# Patient Record
Sex: Male | Born: 1985 | Race: Black or African American | Hispanic: No | Marital: Single
Health system: Southern US, Community
[De-identification: ages and names within clinical notes are randomized; demographics above are authoritative.]

---

## 2010-07-11 ENCOUNTER — Emergency Department (HOSPITAL_BASED_OUTPATIENT_CLINIC_OR_DEPARTMENT_OTHER)
Admission: EM | Admit: 2010-07-11 | Discharge: 2010-07-11 | Disposition: A | Discharge status: Home / Self Care | Payer: Self-pay | Attending: Emergency Medicine | Admitting: Emergency Medicine

## 2010-07-11 ENCOUNTER — Emergency Department (INDEPENDENT_AMBULATORY_CARE_PROVIDER_SITE_OTHER): Payer: Self-pay

## 2010-07-11 DIAGNOSIS — M25579 Pain in unspecified ankle and joints of unspecified foot: Secondary | ICD-10-CM

## 2010-07-11 DIAGNOSIS — S9030XA Contusion of unspecified foot, initial encounter: Secondary | ICD-10-CM

## 2010-07-11 DIAGNOSIS — IMO0002 Reserved for concepts with insufficient information to code with codable children: Secondary | ICD-10-CM | POA: Insufficient documentation

## 2010-07-11 DIAGNOSIS — S93609A Unspecified sprain of unspecified foot, initial encounter: Secondary | ICD-10-CM | POA: Insufficient documentation

## 2011-10-13 IMAGING — CR DG FOOT COMPLETE 3+V*L*
3 series · 3 of 3 positions shown · non-contrast
Comparison: None.

CLINICAL DATA: Injury, pain.

LEFT FOOT - COMPLETE 3+ VIEW

[t foot ap left]
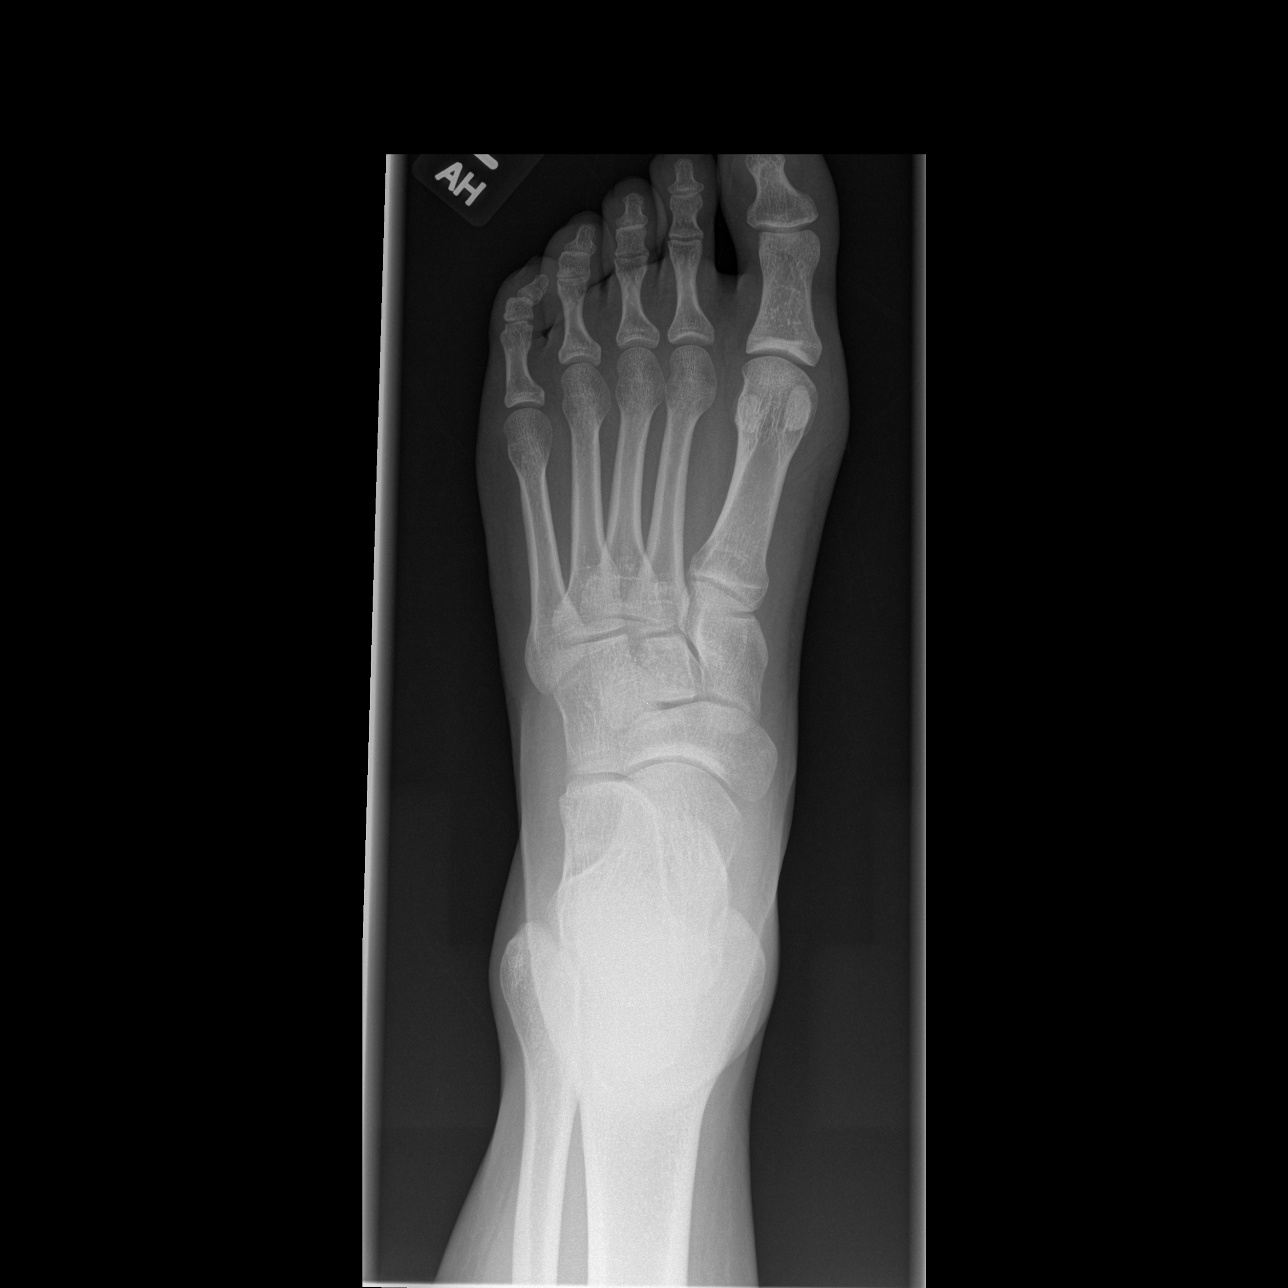

[t foot oblique left]
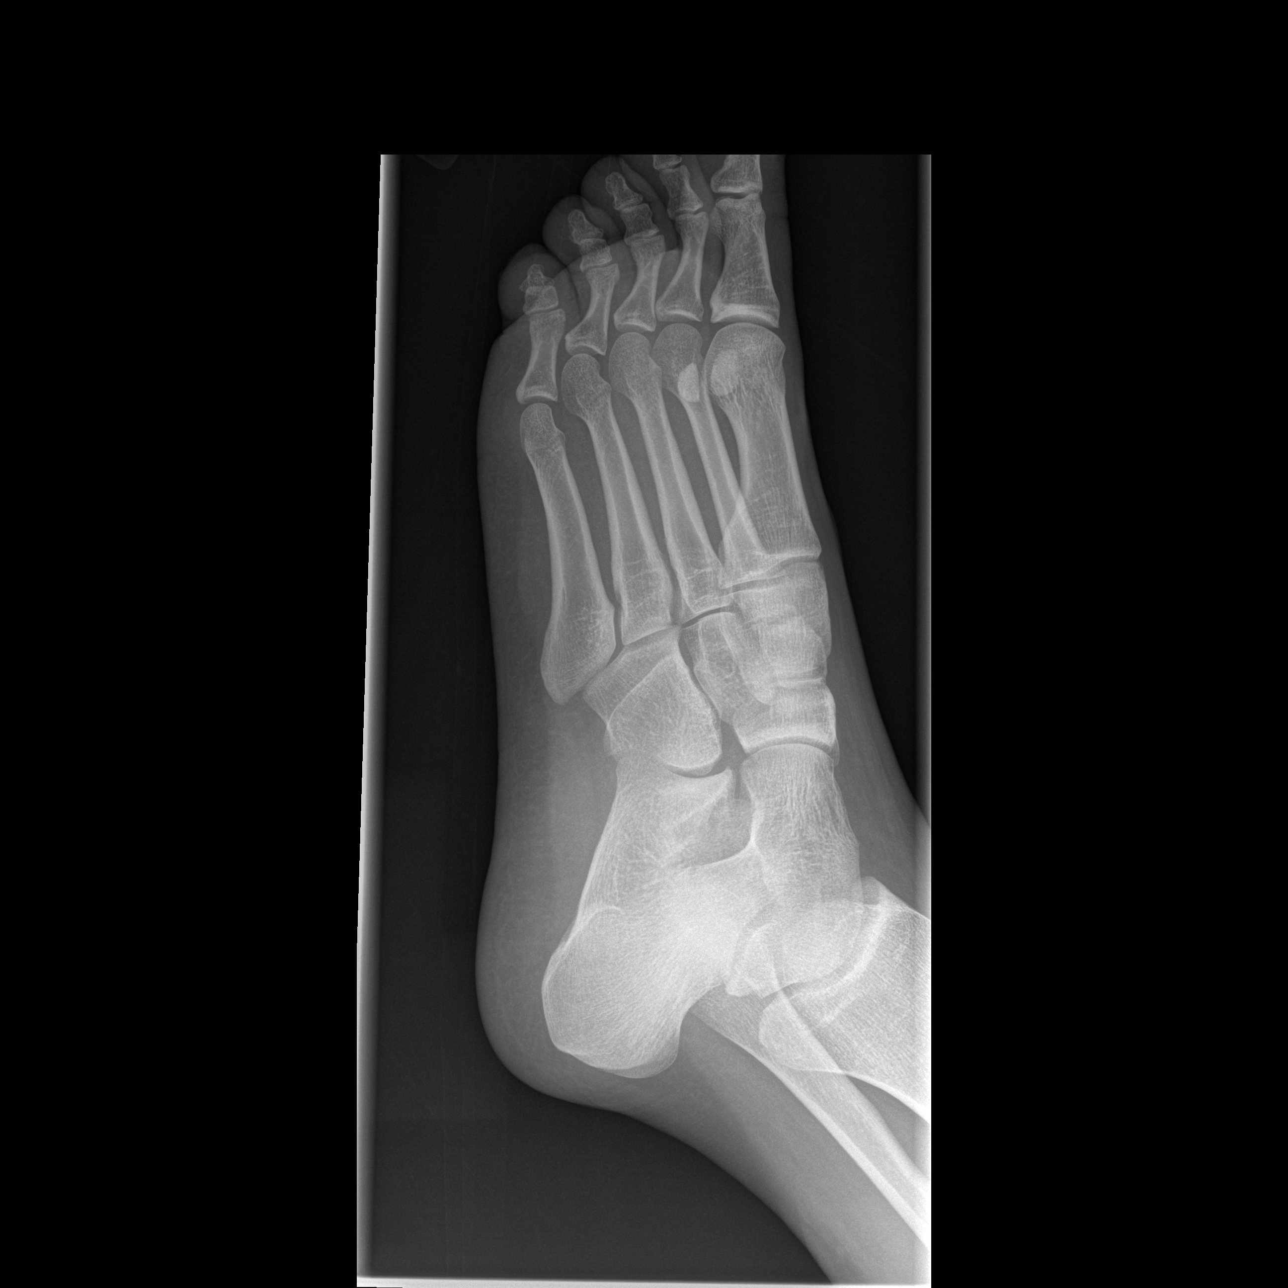

[t foot lat left]
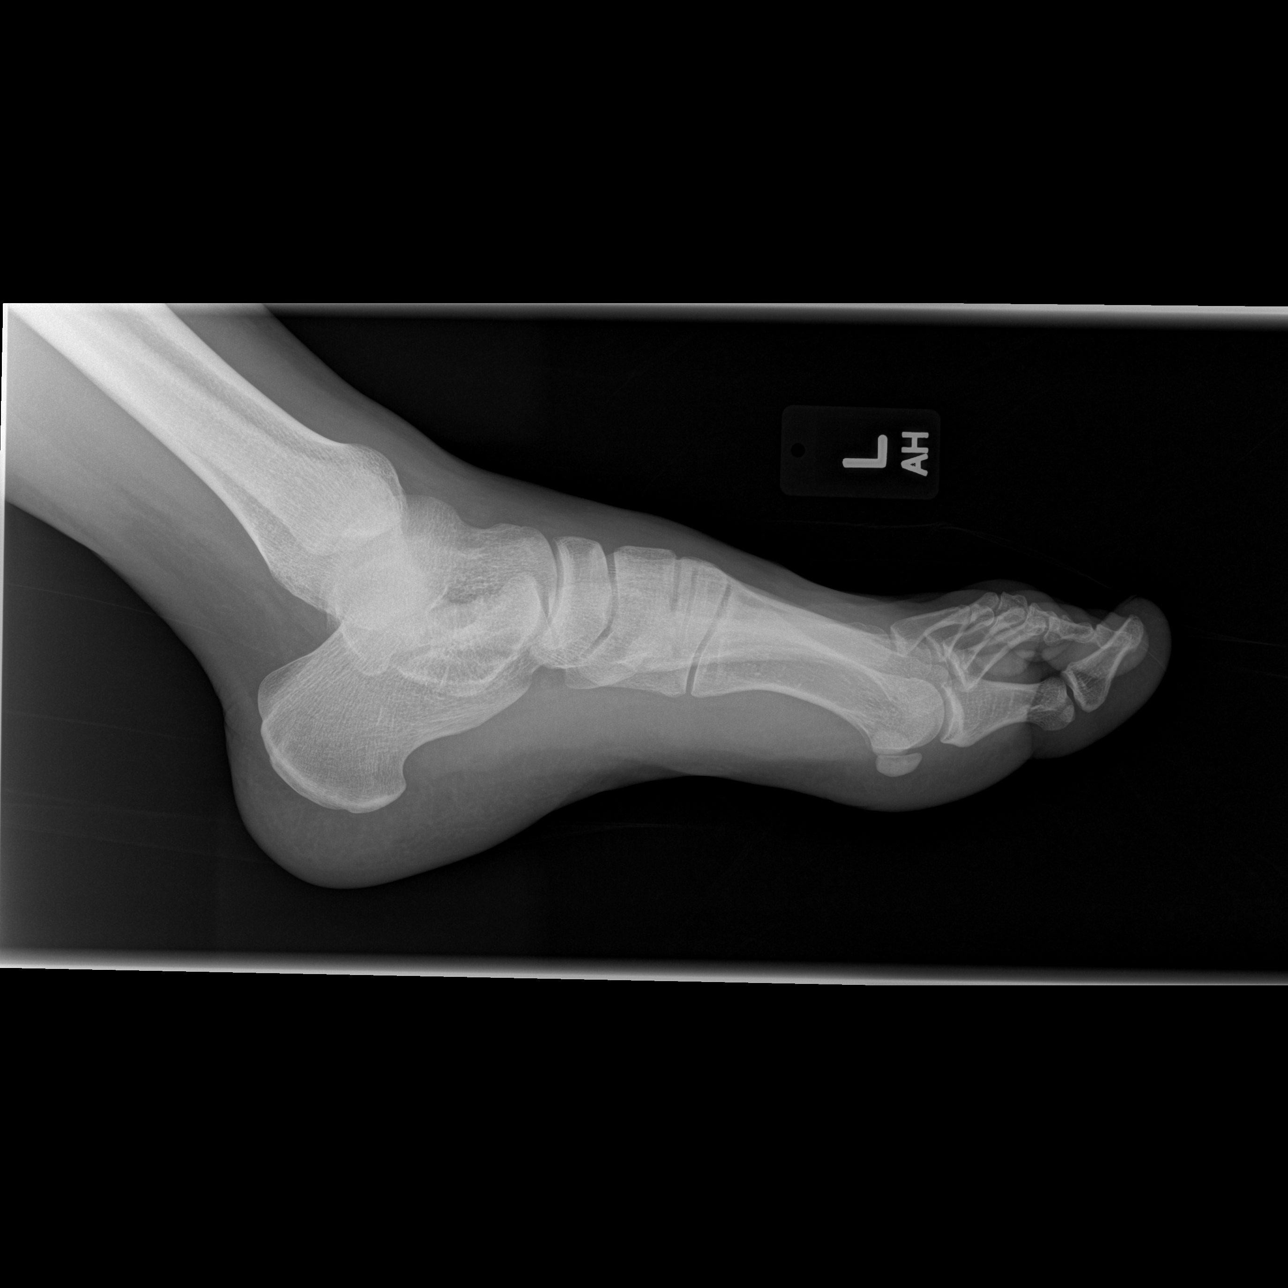

[3 of 3 positions shown; findings below may reference images not displayed]

FINDINGS: Imaged bones, joints and soft tissues appear normal.
IMPRESSION: Negative exam.

## 2011-10-13 IMAGING — CR DG ANKLE COMPLETE 3+V*L*
3 series · 3 of 3 positions shown · non-contrast
Comparison: None.

CLINICAL DATA: Injury, pain.

LEFT ANKLE COMPLETE - 3+ VIEW

[t ankle joint lat left]
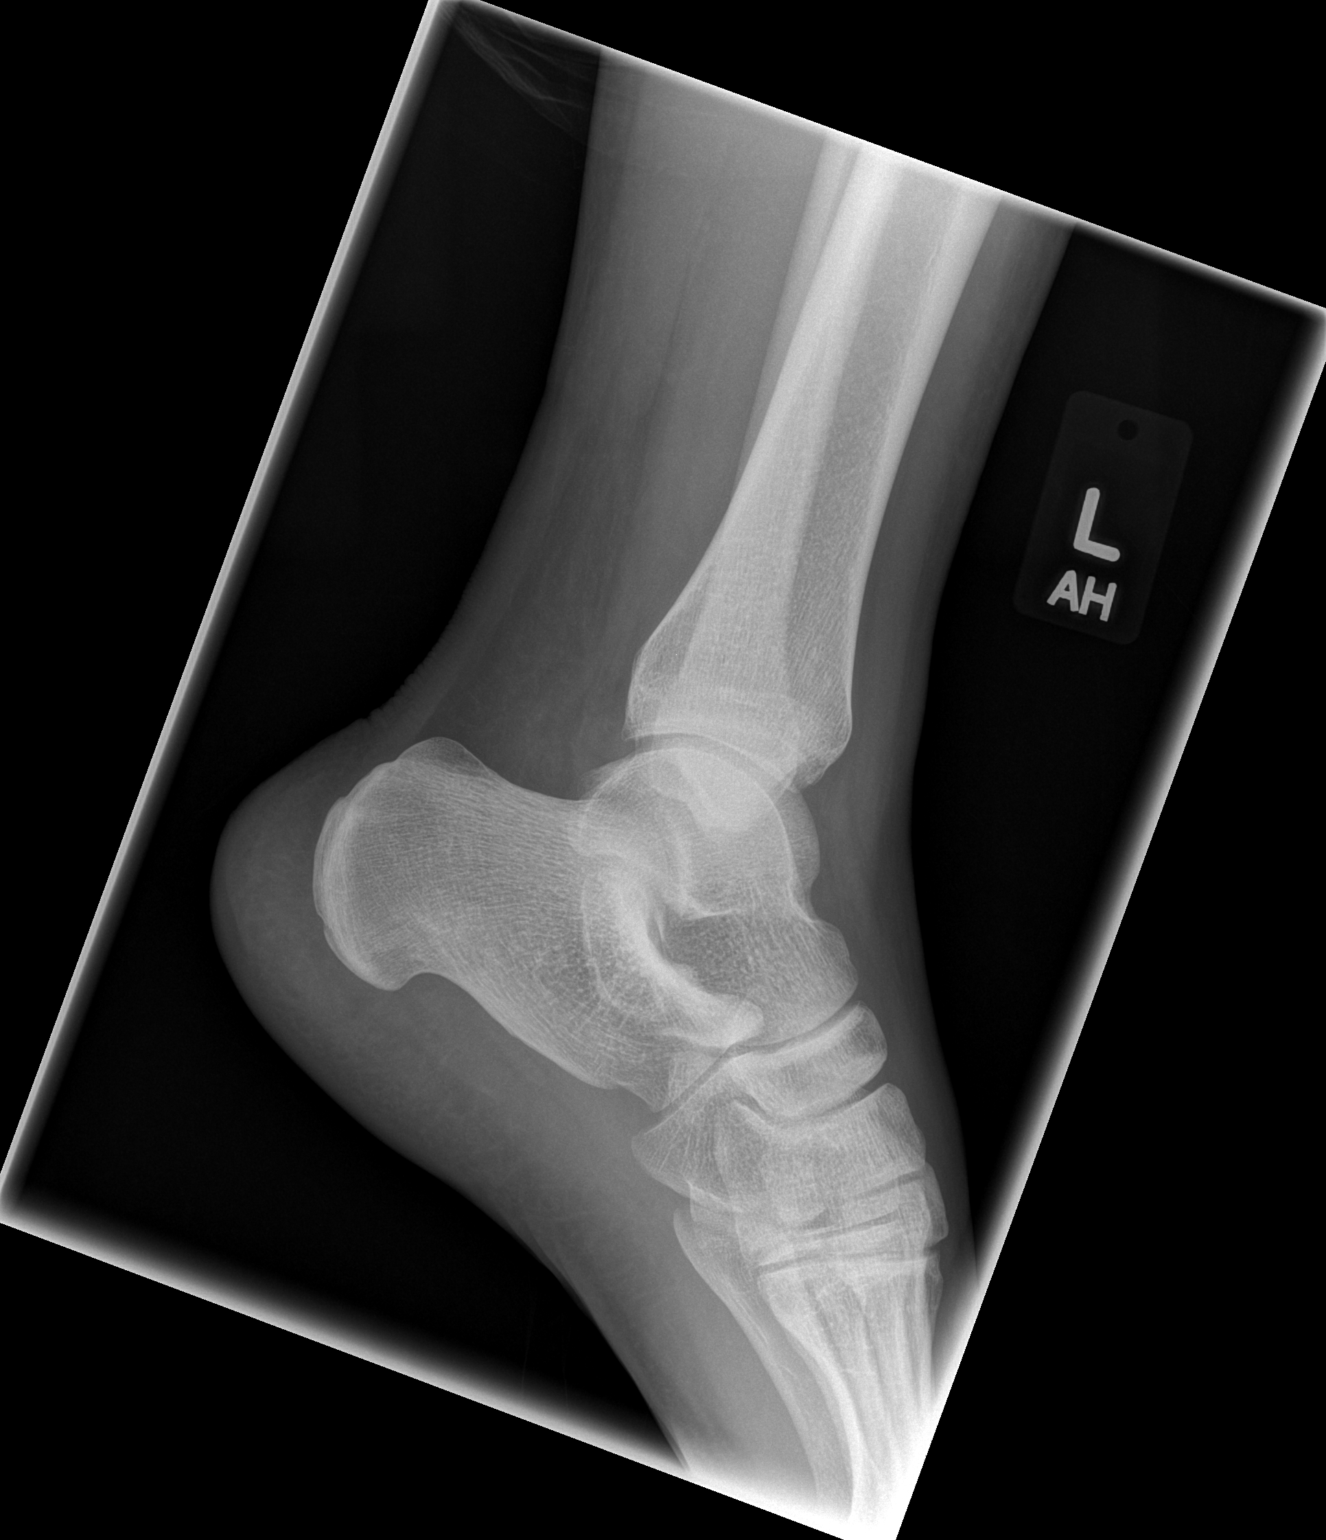

[t ankle joint ap left]
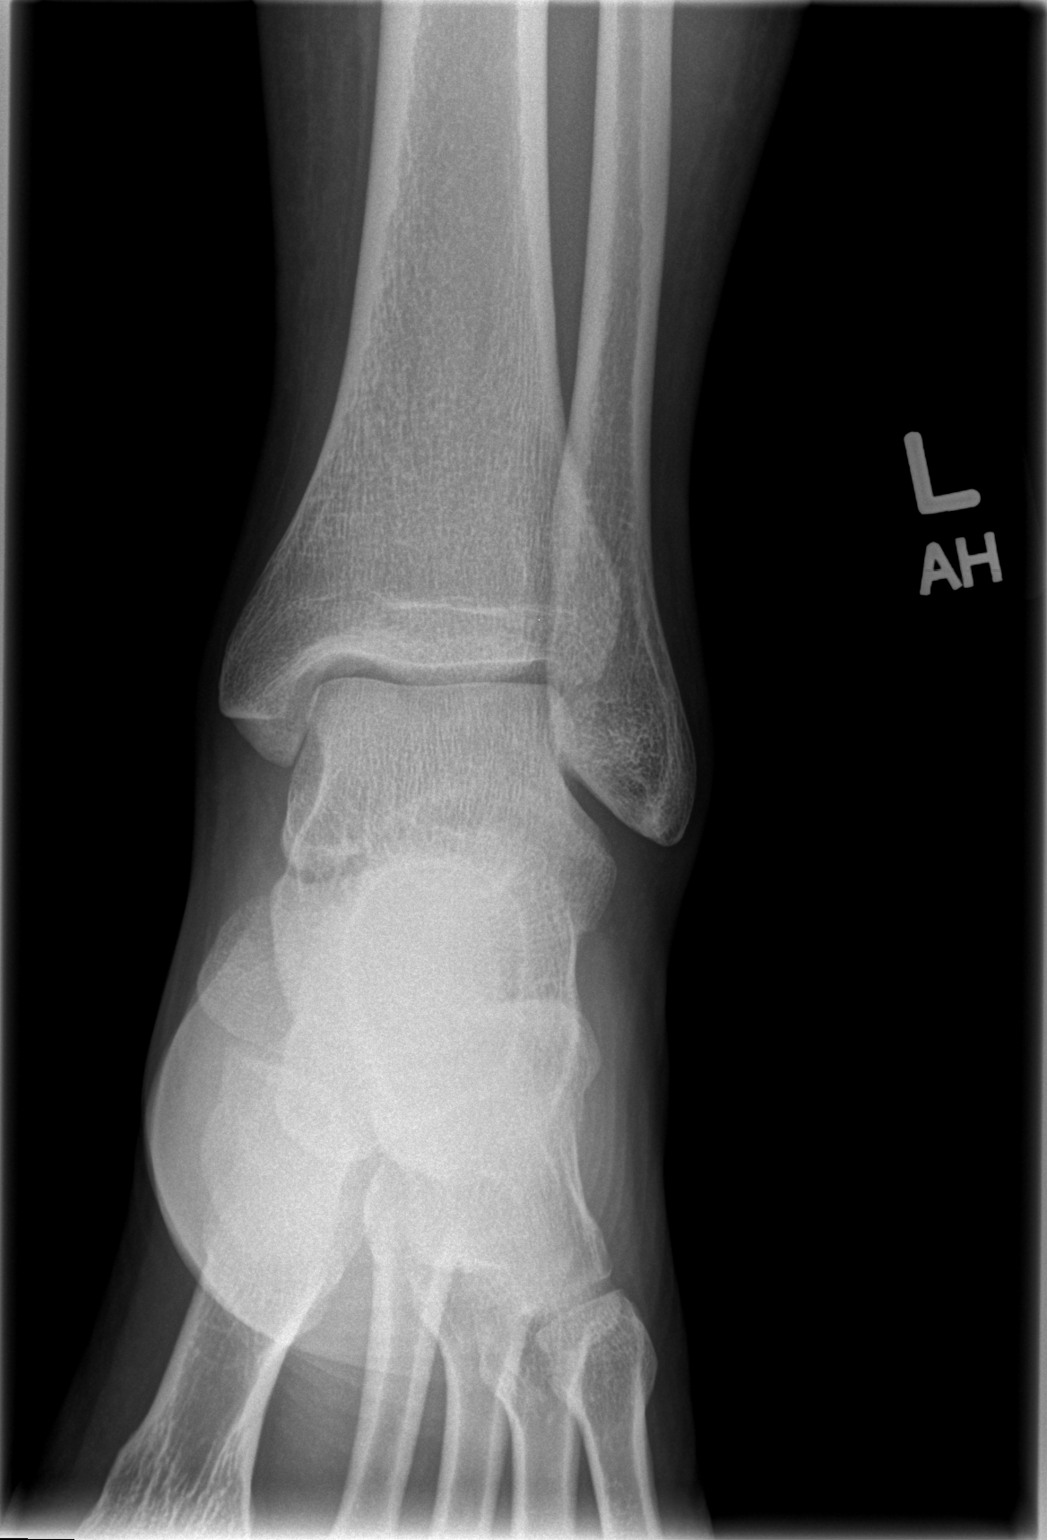

[t ankle joint oblique left]
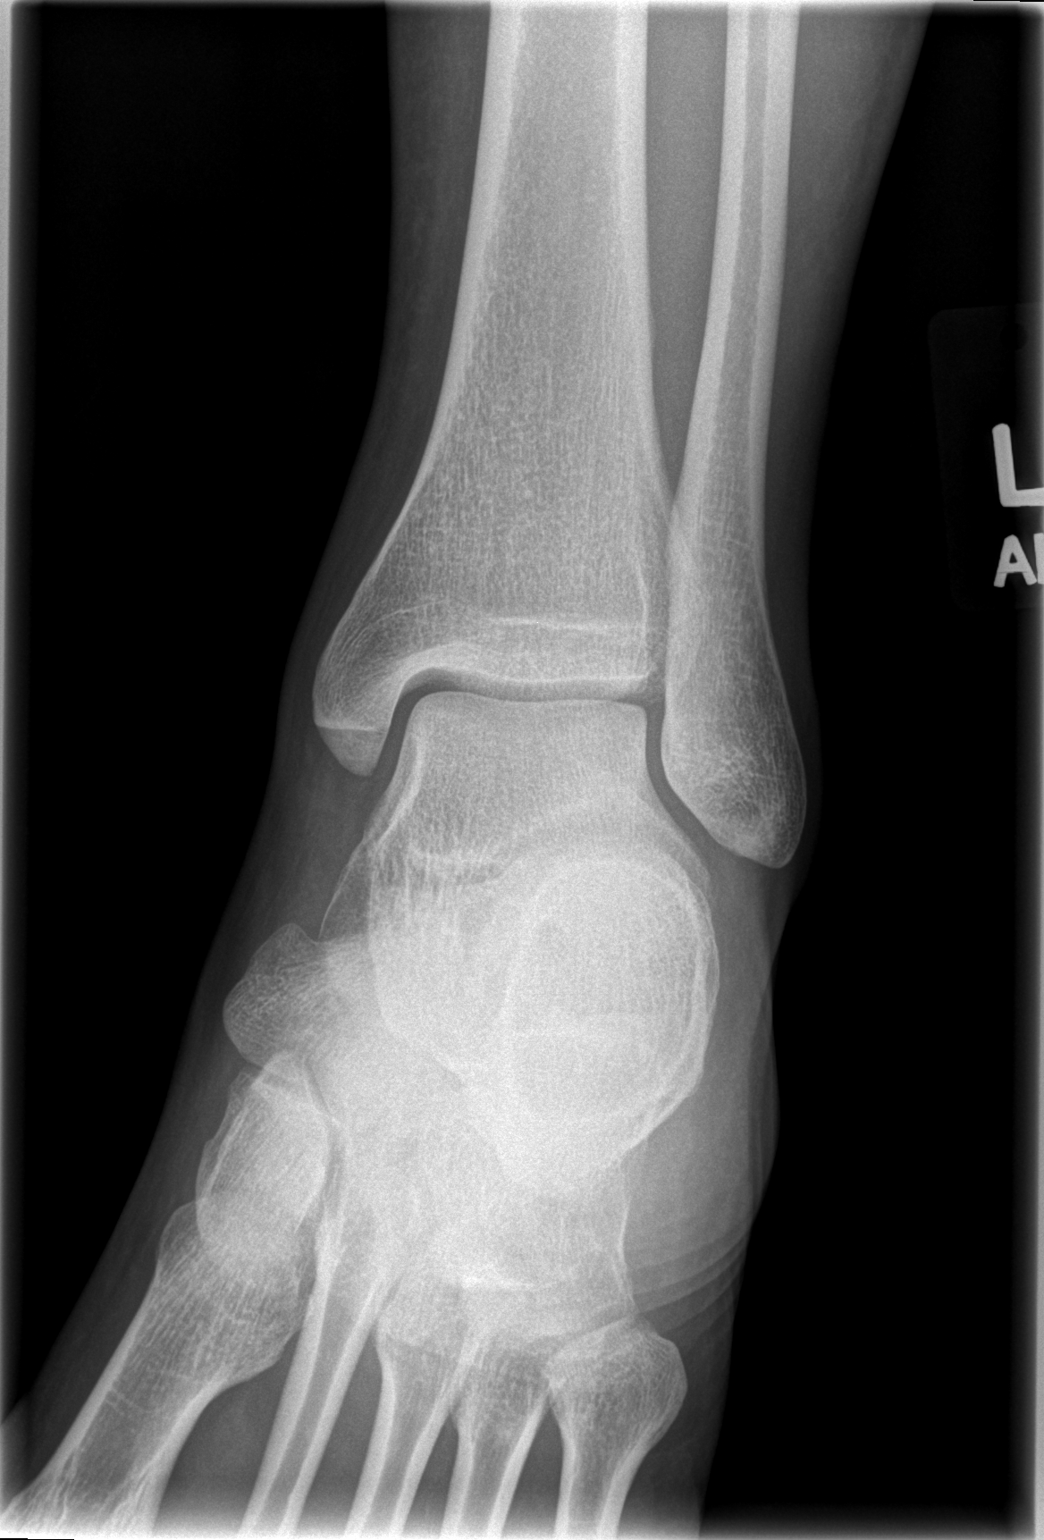

[3 of 3 positions shown; findings below may reference images not displayed]

FINDINGS: Imaged bones, joints and soft tissues appear normal.
IMPRESSION: Negative exam.

## 2022-04-28 ENCOUNTER — Other Ambulatory Visit: Payer: Self-pay

## 2022-04-28 ENCOUNTER — Emergency Department (HOSPITAL_BASED_OUTPATIENT_CLINIC_OR_DEPARTMENT_OTHER)
Admission: EM | Admit: 2022-04-28 | Discharge: 2022-04-28 | Disposition: A | Discharge status: Home / Self Care | Payer: BC Managed Care – PPO | Attending: Emergency Medicine | Admitting: Emergency Medicine

## 2022-04-28 DIAGNOSIS — J029 Acute pharyngitis, unspecified: Secondary | ICD-10-CM | POA: Diagnosis present

## 2022-04-28 LAB — GROUP A STREP BY PCR: Group A Strep by PCR: NOT DETECTED

## 2022-04-28 MED ORDER — IBUPROFEN 800 MG PO TABS: 800.0000 mg | ORAL_TABLET | Freq: Three times a day (TID) | ORAL | 0 refills | Status: AC

## 2022-04-28 NOTE — ED Provider Notes (Signed)
[facility] Provider Note   CSN: [id number] Arrival date & time: 04/28/22  2059     History  Chief Complaint  Patient presents with   Sore Throat    Edward May is a 37 y.o. male here presenting with sore throat.  Patient states that he has 2 jobs.  He is around multiple children who are sick.  Some have strep throat.  He has been having sore throat for about 2 to 3 weeks.  Has nonproductive cough as well.  Also noticed a lymph node on the left side of his neck.  Denies any ear pain.  Denies any fevers.  The history is provided by the patient.       Home Medications Prior to Admission medications   Not on File      Allergies    Patient has no known allergies.    Review of Systems   Review of Systems  HENT:  Positive for sore throat.   All other systems reviewed and are negative.   Physical Exam Updated Vital Signs BP (!) 123/91 (BP Location: Left Arm)   Pulse 62   Temp 98.2 F (36.8 C) (Oral)   Resp 18   Wt 113.4 kg   SpO2 98%  Physical Exam Vitals and nursing note reviewed.  Constitutional:      Appearance: He is well-developed.  HENT:     Head: Normocephalic.     Mouth/Throat:     Mouth: Mucous membranes are moist.     Comments: Posterior pharynx is normal.  No tonsil exudates and uvula is midline Neck:     Comments: Left cervical lymphadenopathy Cardiovascular:     Rate and Rhythm: Normal rate and regular rhythm.     Heart sounds: Normal heart sounds.  Pulmonary:     Effort: Pulmonary effort is normal.     Breath sounds: Normal breath sounds.  Abdominal:     General: Bowel sounds are normal.     Palpations: Abdomen is soft.  Musculoskeletal:     Cervical back: Normal range of motion.  Skin:    General: Skin is warm.     Capillary Refill: Capillary refill takes less than 2 seconds.  Neurological:     General: No focal deficit present.     Mental Status: He is alert and oriented to person, place,  and time.  Psychiatric:        Mood and Affect: Mood normal.        Behavior: Behavior normal.     ED Results / Procedures / Treatments   Labs (all labs ordered are listed, but only abnormal results are displayed) Labs Reviewed  GROUP A STREP BY PCR    EKG None  Radiology No results found.  Procedures Procedures    Medications Ordered in ED Medications - No data to display  ED Course/ Medical Decision Making/ A&P                             Medical Decision Making Edward May is a 37 y.o. male here presenting with sore throat.  Patient is afebrile.  There is no obvious tonsillar exudates.  Strep test is negative.  I think likely viral pharyngitis.  Recommend NSAIDs and outpatient follow-up.   Problems Addressed: Viral pharyngitis: acute illness or injury  Amount and/or Complexity of Data Reviewed Labs: ordered. Decision-making details documented in ED Course.    Final Clinical  Impression(s) / ED Diagnoses Final diagnoses:  None    Rx / DC Orders ED Discharge Orders     None         Drenda Freeze, MD 04/28/22 2243

## 2022-04-28 NOTE — Discharge Instructions (Addendum)
As we discussed, your strep is negative.  You likely have a viral pharyngitis.  Please stay hydrated and take Motrin 800 mg every 6 hours.  It will help with your sore throat as well as your lymph node swelling  See your doctor for follow-up  Return to ER if you have worse sore throat, trouble breathing, fever

## 2022-04-28 NOTE — ED Notes (Signed)
Pt. Was seen by EDP and assessed according to his complaints.  Pt. In no distress and given discharge instructions according to the EDP assessment.

## 2022-04-28 NOTE — ED Triage Notes (Signed)
Pt arrives with c/o sore throat for about 2-3 weeks. Per pt, he has been around children that have had strep throat.
